# Patient Record
Sex: Female | Born: 1995 | Race: Black or African American | Hispanic: No | Marital: Single | Smoking: Never smoker
Health system: Southern US, Community
[De-identification: ages and names within clinical notes are randomized; demographics above are authoritative.]

---

## 2020-04-18 ENCOUNTER — Ambulatory Visit (HOSPITAL_COMMUNITY)
Admission: EM | Admit: 2020-04-18 | Discharge: 2020-04-18 | Disposition: A | Discharge status: Home / Self Care | Payer: Self-pay | Attending: Emergency Medicine | Admitting: Emergency Medicine

## 2020-04-18 ENCOUNTER — Encounter (HOSPITAL_COMMUNITY): Payer: Self-pay

## 2020-04-18 ENCOUNTER — Other Ambulatory Visit: Payer: Self-pay

## 2020-04-18 DIAGNOSIS — R0789 Other chest pain: Secondary | ICD-10-CM

## 2020-04-18 MED ORDER — NAPROXEN 500 MG PO TABS: 500.0000 mg | ORAL_TABLET | Freq: Two times a day (BID) | ORAL | 0 refills | Status: AC | PRN

## 2020-04-18 NOTE — ED Triage Notes (Signed)
Pt c/o central CP acute onset today approx 2 hours PTA. Pt describes CP as "tightness" that was a 9/10 earlier, now a 5/10 pain scale. Reports that extending arms back increases CP, until approx 10 minutes ago CP also increased with inspiration, now no change in pain with breathing. Pt states she removed her bra and CP has improved, increases with palpation.  Denies n/v, SOB, diaphoresis, dizziness, lightheadedness, change in vision/speech, pain to back/jaw or arms.  Pt states she recently started "working out" with weights and worked out Sunday, Monday and Tuesday and also reports that she "had a very stressful day".  Bilateral lung sounds CTA

## 2020-04-18 NOTE — Discharge Instructions (Addendum)
Light stretching, massage, heat application to help with any spasm. ' Light and regular activity as tolerated.  Limit anxiety/stress best as able.  Please return for any worsening of symptoms: shortness of breath , difficulty breathing, leg pain or swelling, palpitations or otherwise worsening.

## 2020-04-18 NOTE — ED Provider Notes (Signed)
[facility]    CSN: [id number] Arrival date & time: 04/18/20  1707      History   Chief Complaint Chief Complaint  Patient presents with  . Chest Pain    HPI Amber Wagner is a 24 y.o. female.   [name] presents with complaints of anterior chest wall pain and soreness which started today. She was at work and got stressed/anxious. States that another person described it as a "panic attack".  Pain has now significantly improved. No shortness of breath . No calf or leg pain. Pain is only if she presses on her chest or if she expands chest by reaching arms back. No palpitations. No nausea or vomiting. No cough. Hasn't taken any medications for symptoms. Denies any previous similar. No difficulty with urination, no concentrated urine. Started working out this week, states she did a cardio work out yesterday, didn't do any lifting or chest work out.    ROS per HPI, negative if not otherwise mentioned.      History reviewed. No pertinent past medical history.  There are no problems to display for this patient.   History reviewed. No pertinent surgical history.  OB History   No obstetric history on file.      Home Medications    Prior to Admission medications   Medication Sig Start Date End Date Taking? Authorizing Provider  naproxen (NAPROSYN) 500 MG tablet Take 1 tablet (500 mg total) by mouth 2 (two) times daily as needed for moderate pain. 04/18/20   Georgetta Haber, NP    Family History Family History  Problem Relation Age of Onset  . Hypertension Father     Social History Social History   Tobacco Use  . Smoking status: Never Smoker  . Smokeless tobacco: Never Used  Vaping Use  . Vaping Use: Never used  Substance Use Topics  . Alcohol use: Yes    Comment: occas  . Drug use: Never     Allergies   Patient has no known allergies.   Review of Systems Review of Systems   Physical Exam Triage Vital Signs ED Triage Vitals  Enc  Vitals Group     BP 04/18/20 1817 (!) 143/92     Pulse Rate 04/18/20 1817 94     Resp 04/18/20 1817 18     Temp 04/18/20 1817 98.2 F (36.8 C)     Temp Source 04/18/20 1817 Oral     SpO2 04/18/20 1817 98 %     Weight --      Height --      Head Circumference --      Peak Flow --      Pain Score 04/18/20 1813 5     Pain Loc --      Pain Edu? --      Excl. in GC? --    No data found.  Updated Vital Signs BP (!) 143/92 (BP Location: Left Arm)   Pulse 94   Temp 98.2 F (36.8 C) (Oral)   Resp 18   LMP 02/25/2020   SpO2 98%    Physical Exam Constitutional:      General: She is not in acute distress.    Appearance: She is well-developed.  Cardiovascular:     Rate and Rhythm: Normal rate.  Pulmonary:     Effort: Pulmonary effort is normal.     Breath sounds: Normal breath sounds.  Chest:     Chest wall: Tenderness present. No crepitus.  Comments: No shortness of breath . No work of breathing. Lungs clear. No pain at rest. Pain is only reproducible with palpation and with thoracic extension; no leg pain or swelling Skin:    General: Skin is warm and dry.  Neurological:     Mental Status: She is alert and oriented to person, place, and time.      UC Treatments / Results  Labs (all labs ordered are listed, but only abnormal results are displayed) Labs Reviewed - No data to display  EKG   Radiology No results found.  Procedures Procedures (including critical care time)  Medications Ordered in UC Medications - No data to display  Initial Impression / Assessment and Plan / UC Course  I have reviewed the triage vital signs and the nursing notes.  Pertinent labs & imaging results that were available during my care of the patient were reviewed by me and considered in my medical decision making (see chart for details).     Chest wall pain. reproducible with muscle engagement. Endorses a lot of stress and anxiety today, as well as recently starting work  out. No indication of pe or acs currently. No uri symptoms. No indication of rhabdo either. nsaids recommended, stretching, heat. Return precautions provided. Patient verbalized understanding and agreeable to plan.   Final Clinical Impressions(s) / UC Diagnoses   Final diagnoses:  Chest wall pain     Discharge Instructions     Light stretching, massage, heat application to help with any spasm. ' Light and regular activity as tolerated.  Limit anxiety/stress best as able.  Please return for any worsening of symptoms: shortness of breath , difficulty breathing, leg pain or swelling, palpitations or otherwise worsening.    ED Prescriptions    Medication Sig Dispense Auth. Provider   naproxen (NAPROSYN) 500 MG tablet Take 1 tablet (500 mg total) by mouth 2 (two) times daily as needed for moderate pain. 30 tablet Georgetta Haber, NP     PDMP not reviewed this encounter.   Georgetta Haber, NP 04/18/20 1914

## 2021-07-27 ENCOUNTER — Emergency Department (HOSPITAL_BASED_OUTPATIENT_CLINIC_OR_DEPARTMENT_OTHER): Payer: Medicaid Other

## 2021-07-27 ENCOUNTER — Other Ambulatory Visit: Payer: Self-pay

## 2021-07-27 ENCOUNTER — Encounter (HOSPITAL_BASED_OUTPATIENT_CLINIC_OR_DEPARTMENT_OTHER): Payer: Self-pay | Admitting: Emergency Medicine

## 2021-07-27 ENCOUNTER — Emergency Department (HOSPITAL_BASED_OUTPATIENT_CLINIC_OR_DEPARTMENT_OTHER)
Admission: EM | Admit: 2021-07-27 | Discharge: 2021-07-27 | Disposition: A | Discharge status: Home / Self Care | Payer: Medicaid Other | Attending: Emergency Medicine | Admitting: Emergency Medicine

## 2021-07-27 DIAGNOSIS — G8911 Acute pain due to trauma: Secondary | ICD-10-CM | POA: Insufficient documentation

## 2021-07-27 DIAGNOSIS — M25512 Pain in left shoulder: Secondary | ICD-10-CM | POA: Insufficient documentation

## 2021-07-27 DIAGNOSIS — W06XXXA Fall from bed, initial encounter: Secondary | ICD-10-CM | POA: Insufficient documentation

## 2021-07-27 MED ORDER — MELOXICAM 15 MG PO TABS: 15.0000 mg | ORAL_TABLET | Freq: Every day | ORAL | 0 refills | Status: AC

## 2021-07-27 MED ORDER — TRAMADOL HCL 50 MG PO TABS
50.0000 mg | ORAL_TABLET | Freq: Four times a day (QID) | ORAL | 0 refills | Status: AC | PRN
Start: 2021-07-27 — End: ?

## 2021-07-27 NOTE — ED Notes (Signed)
Dc instructions reviewed with patient. Patient voiced understanding. Dc with belongings.  °

## 2021-07-27 NOTE — ED Provider Notes (Signed)
?[facility] ?Provider Note ? ? ?CSN: [id number] ?Arrival date & time: 07/27/21  1210 ? ?  ? ?History ? ?Chief Complaint  ?Patient presents with  ? Shoulder Injury  ? ? ?Amber Wagner is a 26 y.o. female.  Patient complains of left shoulder pain secondary to a fall.  The patient states that last night she was in bed and rolled out of bed falling on her left shoulder.  She had immediate pain.  The patient is able to move her arm though does cause her pain.  She denies hitting her head, denies loss of consciousness.  No relevant past medical history ? ?HPI ? ?  ? ?Home Medications ?Prior to Admission medications   ?Medication Sig Start Date End Date Taking? Authorizing Provider  ?meloxicam (MOBIC) 15 MG tablet Take 1 tablet (15 mg total) by mouth daily for 15 days. 07/27/21 08/11/21 Yes Darrick Grinder, PA-C  ?traMADol (ULTRAM) 50 MG tablet Take 1 tablet (50 mg total) by mouth every 6 (six) hours as needed. 07/27/21  Yes Darrick Grinder, PA-C  ?naproxen (NAPROSYN) 500 MG tablet Take 1 tablet (500 mg total) by mouth 2 (two) times daily as needed for moderate pain. 04/18/20   Georgetta Haber, NP  ?   ? ?Allergies    ?Patient has no known allergies.   ? ?Review of Systems   ?Review of Systems  ?Musculoskeletal:  Positive for arthralgias.  ?     Left shoulder pain  ?Neurological:  Negative for syncope and headaches.  ? ?Physical Exam ?Updated Vital Signs ?BP 120/81 (BP Location: Right Arm)   Pulse 94   Temp 98.3 ?F (36.8 ?C) (Oral)   Resp 18   Ht 5\' 5"  (1.651 m)   Wt 92.1 kg   SpO2 98%   BMI 33.78 kg/m?  ?Physical Exam ?Vitals and nursing note reviewed.  ?Constitutional:   ?   General: She is not in acute distress. ?HENT:  ?   Head: Normocephalic and atraumatic.  ?Eyes:  ?   Conjunctiva/sclera: Conjunctivae normal.  ?Cardiovascular:  ?   Rate and Rhythm: Normal rate.  ?   Pulses: Normal pulses.  ?Pulmonary:  ?   Effort: Pulmonary effort is normal.  ?Musculoskeletal:     ?   General:  Tenderness present. No swelling or deformity.  ?   Cervical back: Normal range of motion.  ?   Comments: Patient able to internally and externally rotate shoulder, abduct and adduct shoulder.  Pain with resisted external rotation and resisted abduction.  ?Skin: ?   General: Skin is warm and dry.  ?Neurological:  ?   Mental Status: She is alert.  ? ? ?ED Results / Procedures / Treatments   ?Labs ?(all labs ordered are listed, but only abnormal results are displayed) ?Labs Reviewed - No data to display ? ?EKG ?None ? ?Radiology ?DG Shoulder Left Port ? ?Result Date: 07/27/2021 ?CLINICAL DATA:  Fall EXAM: LEFT SHOULDER COMPARISON:  None. FINDINGS: There is no evidence of fracture or dislocation. There is no evidence of arthropathy or other focal bone abnormality. Soft tissues are unremarkable. IMPRESSION: Negative. Electronically Signed   By: 09/26/2021 M.D.   On: 07/27/2021 12:54   ? ?Procedures ?06/01/2023Ortho Injury Treatment ? ?Date/Time: 07/27/2021 1:24 PM ?Performed by: 09/26/2021, PA-C ?Authorized by: Darrick Grinder, PA-C  ? ?Consent:  ?  Consent obtained:  Verbal ?  Consent given by:  Patient ?  Risks discussed:  Restricted joint movement ?  Alternatives discussed:  No treatmentInjury location: shoulder ?Location details: left shoulder ?Injury type: soft tissue ?Pre-procedure neurovascular assessment: neurovascularly intact ? ?Anesthesia: ?Local anesthesia used: no ? ?Patient sedated: NoImmobilization: sling ?Splint Applied by: ED Nurse ?Post-procedure neurovascular assessment: post-procedure neurovascularly intact ? ?  ? ? ?Medications Ordered in ED ?Medications - No data to display ? ?ED Course/ Medical Decision Making/ A&P ?  ?                        ?Medical Decision Making ?Amount and/or Complexity of Data Reviewed ?Radiology: ordered. ? ? ?Patient presents with concerns of left shoulder pain after a fall.  Differential diagnosis includes fracture, dislocation, rotator cuff tear, and others. ? ?I ordered  and interpreted plain films of the left shoulder.  No fracture or dislocation noted.  I agree with the radiologist findings. ? ?Patient may have a rotator cuff injury or other soft tissue injury in the left shoulder.  I recommend outpatient follow-up with orthopedics.  There is no indication for further emergent work-up at this time.  I will have the patient placed in a sling.  Patient assures me she is not pregnant. I will prescribe short course of pain medication and some anti-inflammatories for the patient.  Follow-up with orthopedics. ? ? ?Final Clinical Impression(s) / ED Diagnoses ?Final diagnoses:  ?Acute pain of left shoulder  ? ? ?Rx / DC Orders ?ED Discharge Orders   ? ?      Ordered  ?  meloxicam (MOBIC) 15 MG tablet  Daily       ? 07/27/21 1318  ?  traMADol (ULTRAM) 50 MG tablet  Every 6 hours PRN       ? 07/27/21 1318  ? ?  ?  ? ?  ? ? ?  ?Darrick Grinder, PA-C ?07/27/21 1325 ? ?  ?Margarita Grizzle, MD ?07/27/21 1526 ? ?

## 2021-07-27 NOTE — ED Triage Notes (Signed)
Pt via pov from home with shoulder injury from a fall last night. Pt denies LOC, states she was reaching for something and fell. Pt reports pain in her left shoulder, as well as limited mobility. Pt alert & oriented, nad noted.  ?

## 2021-07-27 NOTE — Discharge Instructions (Addendum)
You were seen today for left shoulder pain.  I have prescribed anti-inflammatory pain medication.  I recommend calling and scheduling appointment for follow-up with orthopedic surgery.  You may take the sling off as needed for bathing and for comfort.  Return to the emergency department if you develop life-threatening conditions ? ?STOP taking these medications if you become pregnant ?

## 2022-03-01 ENCOUNTER — Other Ambulatory Visit: Payer: Self-pay

## 2022-03-01 ENCOUNTER — Encounter (HOSPITAL_BASED_OUTPATIENT_CLINIC_OR_DEPARTMENT_OTHER): Payer: Self-pay

## 2022-03-01 ENCOUNTER — Emergency Department (HOSPITAL_BASED_OUTPATIENT_CLINIC_OR_DEPARTMENT_OTHER)
Admission: EM | Admit: 2022-03-01 | Discharge: 2022-03-01 | Disposition: A | Discharge status: Home / Self Care | Payer: Medicaid Other | Attending: Emergency Medicine | Admitting: Emergency Medicine

## 2022-03-01 DIAGNOSIS — Z113 Encounter for screening for infections with a predominantly sexual mode of transmission: Secondary | ICD-10-CM | POA: Insufficient documentation

## 2022-03-01 LAB — URINALYSIS, ROUTINE W REFLEX MICROSCOPIC
Bilirubin Urine: NEGATIVE
Glucose, UA: NEGATIVE mg/dL
Hgb urine dipstick: NEGATIVE
Ketones, ur: NEGATIVE mg/dL
Nitrite: NEGATIVE
Specific Gravity, Urine: 1.025 (ref 1.005–1.030)
pH: 5.5 (ref 5.0–8.0)

## 2022-03-01 LAB — RAPID HIV SCREEN (HIV 1/2 AB+AG)
HIV 1/2 Antibodies: NONREACTIVE
HIV-1 P24 Antigen - HIV24: NONREACTIVE

## 2022-03-01 LAB — PREGNANCY, URINE: Preg Test, Ur: NEGATIVE

## 2022-03-01 LAB — WET PREP, GENITAL
Clue Cells Wet Prep HPF POC: NONE SEEN
Sperm: NONE SEEN
Trich, Wet Prep: NONE SEEN
WBC, Wet Prep HPF POC: <10 (ref <10)

## 2022-03-01 MED ORDER — LIDOCAINE HCL (PF) 1 % IJ SOLN
1.0000 mL | Freq: Once | INTRAMUSCULAR | Status: AC
  Administered 2022-03-01: 2 mL
  Filled 2022-03-01: qty 5

## 2022-03-01 MED ORDER — FLUCONAZOLE 150 MG PO TABS: 150.0000 mg | ORAL_TABLET | Freq: Every day | ORAL | 0 refills | Status: AC

## 2022-03-01 MED ORDER — FLUCONAZOLE 150 MG PO TABS
150.0000 mg | ORAL_TABLET | Freq: Once | ORAL | Status: AC
  Administered 2022-03-01: 150 mg via ORAL
  Filled 2022-03-01: qty 1

## 2022-03-01 MED ORDER — CEFTRIAXONE SODIUM 500 MG IJ SOLR
500.0000 mg | Freq: Once | INTRAMUSCULAR | Status: AC
  Administered 2022-03-01: 500 mg via INTRAMUSCULAR
  Filled 2022-03-01: qty 500

## 2022-03-01 MED ORDER — DOXYCYCLINE HYCLATE 100 MG PO CAPS: 100.0000 mg | ORAL_CAPSULE | Freq: Two times a day (BID) | ORAL | 0 refills | Status: AC

## 2022-03-01 MED ORDER — DOXYCYCLINE HYCLATE 100 MG PO TABS
100.0000 mg | ORAL_TABLET | Freq: Once | ORAL | Status: AC
  Administered 2022-03-01: 100 mg via ORAL
  Filled 2022-03-01: qty 1

## 2022-03-01 NOTE — ED Triage Notes (Signed)
Pt wanting an STI check ,her boyfriend was positive last week.

## 2022-03-01 NOTE — Discharge Instructions (Signed)
Your HIV and syphilis test will not be back today, you will be contacted if you test positive for either.  Please take your antibiotics as prescribed, do not have sex with your partner until both of you have completed your course of antibiotics.  Also take your fluconazole, after completing your dose of doxycycline.  You received 1 dose today secondary to yeast infection.  Return to the ER if you have severe pelvic or abdominal pain, fever or chills.

## 2022-03-01 NOTE — ED Provider Notes (Signed)
[place] [facility] DE[facility] Provider Note   CSN: [id number] Arrival date & time: 03/01/22  1124     History  Chief Complaint  Patient presents with   Amber Wagner is a 26 y.o. female, who presents to the ED secondary to having her partner tested positive For chlamydia last week.  She states that they have not been sexually active, since he was diagnosed, but wants to be checked for STDs.  Denies any vaginal discharge pelvic pain, abdominal pain urinary symptoms.    Home Medications Prior to Admission medications   Medication Sig Start Date End Date Taking? Authorizing Provider  doxycycline (VIBRAMYCIN) 100 MG capsule Take 1 capsule (100 mg total) by mouth 2 (two) times daily for 7 days. 03/01/22 03/08/22 Yes Dezeray Puccio L, PA  fluconazole (DIFLUCAN) 150 MG tablet Take 1 tablet (150 mg total) by mouth daily. 03/01/22  Yes Daesean Lazarz L, PA  naproxen (NAPROSYN) 500 MG tablet Take 1 tablet (500 mg total) by mouth 2 (two) times daily as needed for moderate pain. 04/18/20   Zigmund Gottron, NP  traMADol (ULTRAM) 50 MG tablet Take 1 tablet (50 mg total) by mouth every 6 (six) hours as needed. 07/27/21   Dorothyann Peng, PA-C      Allergies    Patient has no known allergies.    Review of Systems   Review of Systems  Constitutional:  Negative for chills and fever.  Genitourinary:  Negative for dysuria, flank pain, pelvic pain, urgency and vaginal discharge.    Physical Exam Updated Vital Signs BP (!) 138/95 (BP Location: Right Arm)   Pulse (!) 108   Temp 97.9 F (36.6 C)   Resp 18   Ht 5\' 5"  (1.651 m)   Wt 92.1 kg   SpO2 100%   BMI 33.78 kg/m  Physical Exam Vitals and nursing note reviewed.  Constitutional:      General: She is not in acute distress.    Appearance: She is well-developed.  HENT:     Head: Normocephalic and atraumatic.  Eyes:     Conjunctiva/sclera: Conjunctivae normal.  Cardiovascular:     Rate and  Rhythm: Normal rate and regular rhythm.     Heart sounds: No murmur heard. Pulmonary:     Effort: Pulmonary effort is normal. No respiratory distress.     Breath sounds: Normal breath sounds.  Abdominal:     Palpations: Abdomen is soft.     Tenderness: There is no abdominal tenderness.  Genitourinary:    Comments: Pt declined Musculoskeletal:        General: No swelling.     Cervical back: Neck supple.  Skin:    General: Skin is warm and dry.     Capillary Refill: Capillary refill takes less than 2 seconds.  Neurological:     Mental Status: She is alert.  Psychiatric:        Mood and Affect: Mood normal.     ED Results / Procedures / Treatments   Labs (all labs ordered are listed, but only abnormal results are displayed) Labs Reviewed  WET PREP, GENITAL - Abnormal; Notable for the following components:      Result Value   Yeast Wet Prep HPF POC PRESENT (*)    All other components within normal limits  URINALYSIS, ROUTINE W REFLEX MICROSCOPIC - Abnormal; Notable for the following components:   APPearance HAZY (*)    Protein, ur TRACE (*)    Leukocytes,Ua TRACE (*)  Bacteria, UA RARE (*)    All other components within normal limits  PREGNANCY, URINE  RPR  RAPID HIV SCREEN (HIV 1/2 AB+AG)  GC/CHLAMYDIA PROBE AMP ([place]) NOT AT [facility]    EKG None  Radiology No results found.  Procedures Procedures    Medications Ordered in ED Medications  cefTRIAXone (ROCEPHIN) injection 500 mg (has no administration in time range)  lidocaine (PF) (XYLOCAINE) 1 % injection 1-2.1 mL (has no administration in time range)  doxycycline (VIBRA-TABS) tablet 100 mg (has no administration in time range)  fluconazole (DIFLUCAN) tablet 150 mg (has no administration in time range)    ED Course/ Medical Decision Making/ A&P                           Medical Decision Making Here for STD testing, states boyfriend tested positive for chlamydia last week.  Has not had sex since  then.  Will prophylactically treat, give ceftriaxone, doxycycline.  Prescription sent to pharmacy.  We will also treat with yeast infection.  Return precautions emphasized.  Discussed syphilis HIV testing, and she will have to follow-up on these as they will not be completed today.  Amount and/or Complexity of Data Reviewed Labs: ordered.    Details: Reviewed labs, positive for yeast infection. Discussion of management or test interpretation with external provider(s): We will treat yeast infection, gonorrhea, chlamydia.  Pregnancy test negative.  Return precautions emphasized.  Risk Prescription drug management.   Final Clinical Impression(s) / ED Diagnoses Final diagnoses:  Routine screening for STI (sexually transmitted infection)    Rx / DC Orders ED Discharge Orders          Ordered    doxycycline (VIBRAMYCIN) 100 MG capsule  2 times daily        03/01/22 1405    fluconazole (DIFLUCAN) 150 MG tablet  Daily        03/01/22 1405              Tyneka Scafidi, [place], PA 03/01/22 1411    Glynn Octave, MD 03/01/22 1639

## 2022-03-01 NOTE — ED Notes (Signed)
Patient refused blood draw at this time.  States she "had that checked three months ago"

## 2022-03-02 LAB — RPR: RPR Ser Ql: NONREACTIVE

## 2022-03-03 LAB — GC/CHLAMYDIA PROBE AMP (~~LOC~~) NOT AT ARMC
Chlamydia: POSITIVE — AB
Comment: NEGATIVE
Comment: NORMAL
Neisseria Gonorrhea: NEGATIVE

## 2022-10-15 IMAGING — DX DG SHOULDER 1V*L*
2 series · 2 of 2 positions shown · non-contrast
Comparison: None.

CLINICAL DATA: Fall

EXAM:
LEFT SHOULDER

[shoulder ap]
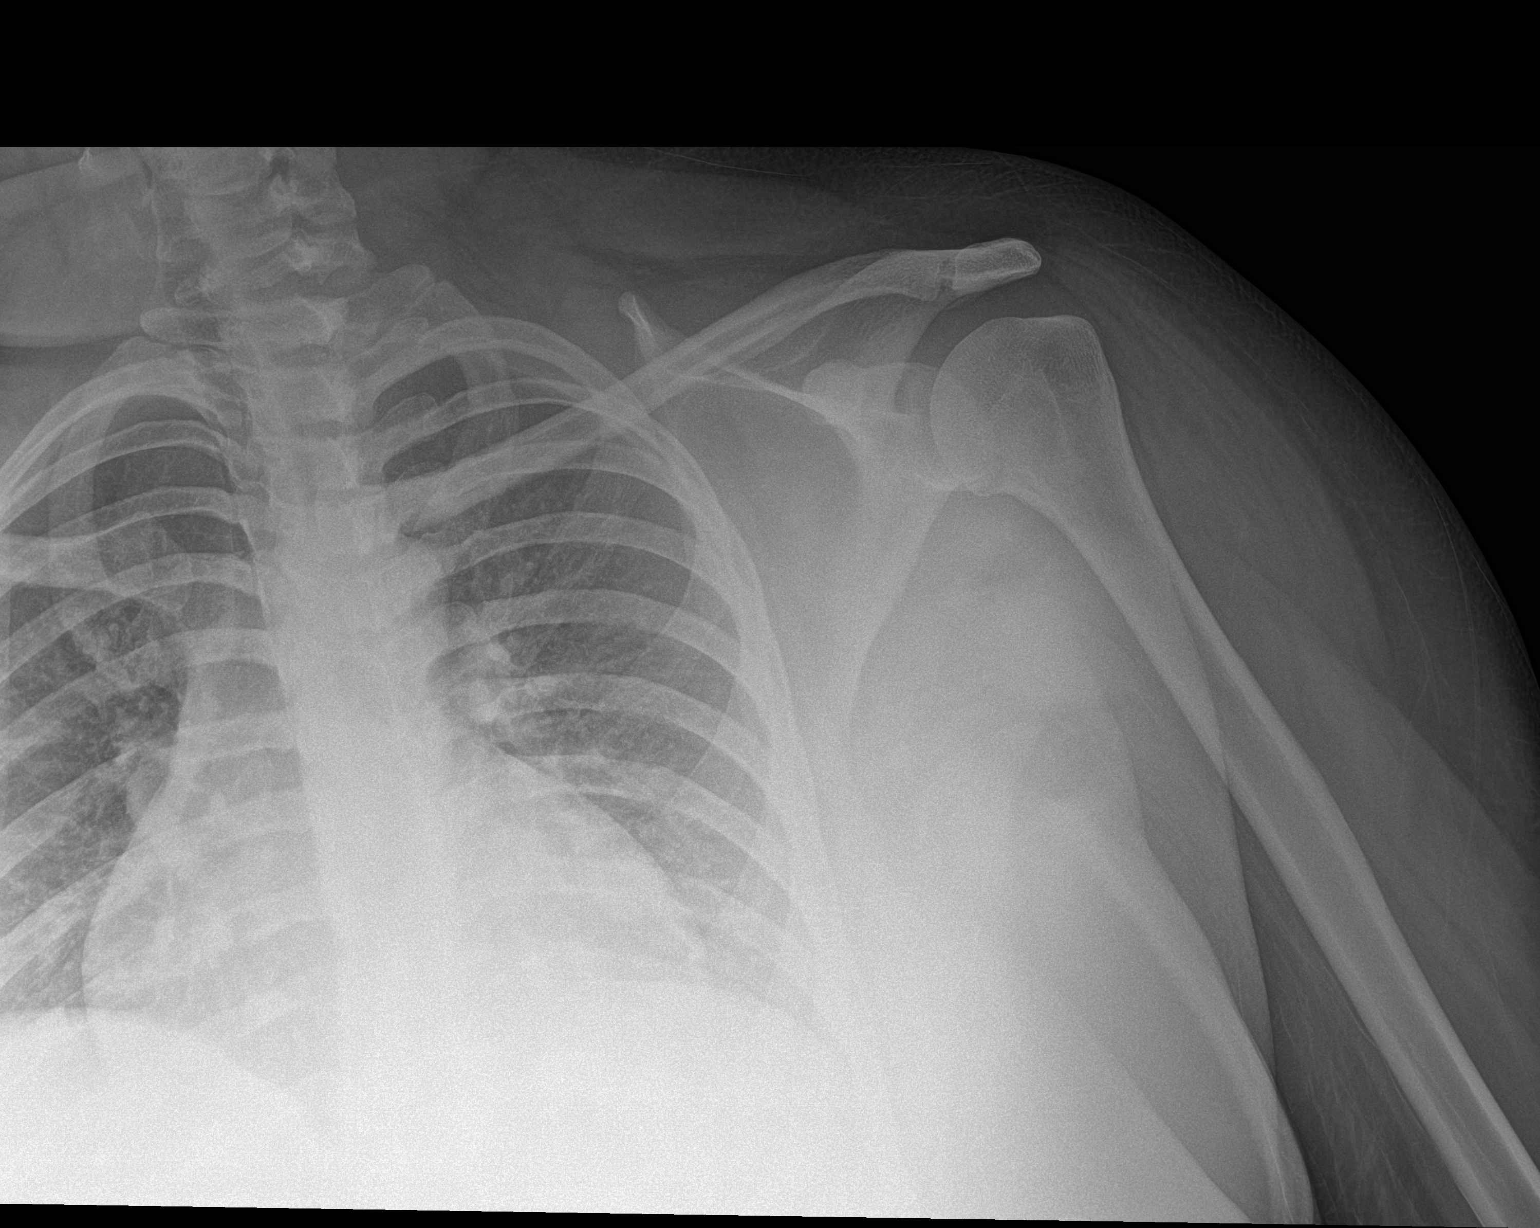

[shoulder obl]
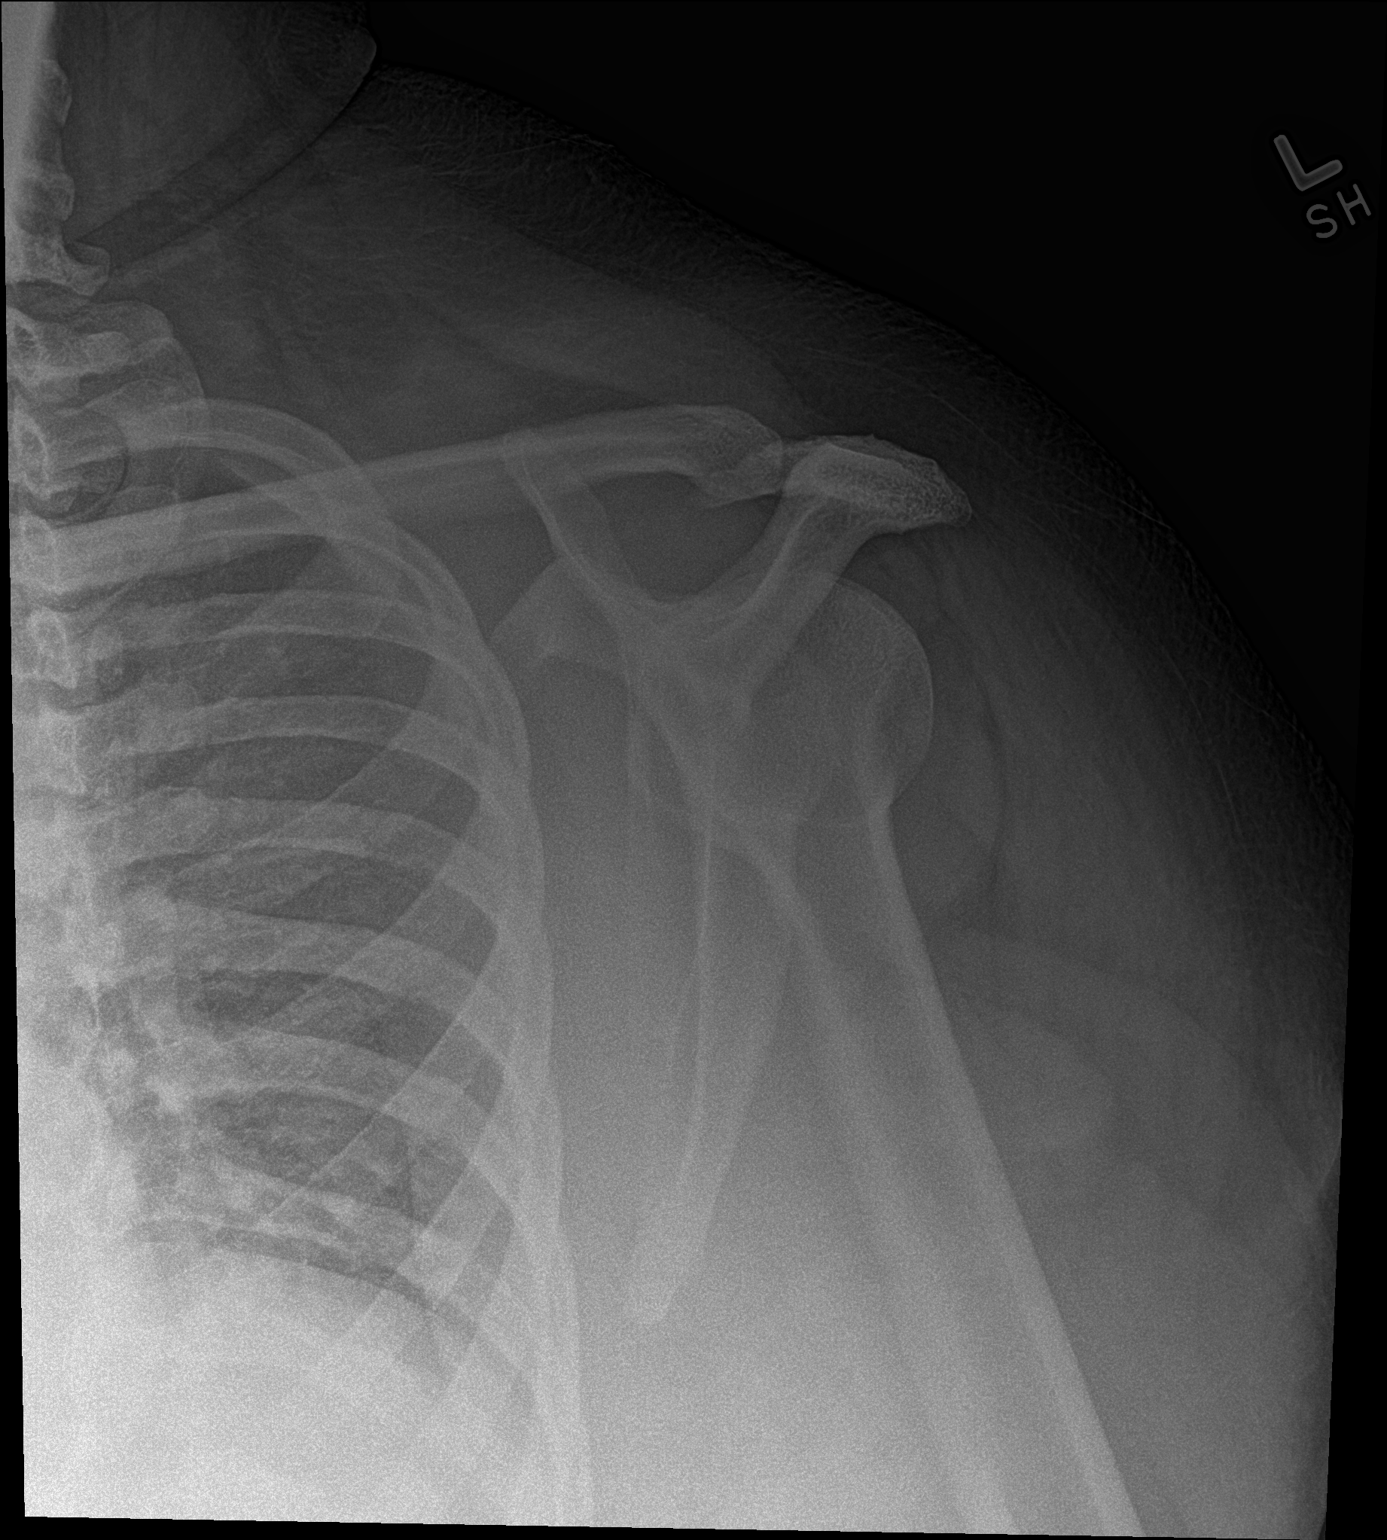

[2 of 2 positions shown; findings below may reference images not displayed]

FINDINGS: There is no evidence of fracture or dislocation. There is no
evidence of arthropathy or other focal bone abnormality. Soft
tissues are unremarkable.
IMPRESSION: Negative.
